# Patient Record
Sex: Female | Born: 2005 | Race: White | Hispanic: Yes | Marital: Single | Smoking: Never smoker
Health system: Southern US, Community
[De-identification: ages and names within clinical notes are randomized; demographics above are authoritative.]

## PROBLEM LIST (undated history)

## (undated) DIAGNOSIS — R569 Unspecified convulsions: Secondary | ICD-10-CM

---
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
## 2023-03-18 ENCOUNTER — Emergency Department (HOSPITAL_COMMUNITY)
Admission: EM | Admit: 2023-03-18 | Discharge: 2023-03-18 | Disposition: A | Discharge status: Home / Self Care | Payer: Medicaid Other | Attending: Emergency Medicine | Admitting: Emergency Medicine

## 2023-03-18 ENCOUNTER — Other Ambulatory Visit: Payer: Self-pay

## 2023-03-18 DIAGNOSIS — E860 Dehydration: Secondary | ICD-10-CM | POA: Diagnosis not present

## 2023-03-18 DIAGNOSIS — F329 Major depressive disorder, single episode, unspecified: Secondary | ICD-10-CM | POA: Insufficient documentation

## 2023-03-18 DIAGNOSIS — F32A Depression, unspecified: Secondary | ICD-10-CM | POA: Diagnosis not present

## 2023-03-18 DIAGNOSIS — R1084 Generalized abdominal pain: Secondary | ICD-10-CM | POA: Diagnosis not present

## 2023-03-18 DIAGNOSIS — R109 Unspecified abdominal pain: Secondary | ICD-10-CM | POA: Diagnosis present

## 2023-03-18 LAB — BASIC METABOLIC PANEL
Anion gap: 9 (ref 5–15)
BUN: 9 mg/dL (ref 6–20)
CO2: 25 mmol/L (ref 22–32)
Calcium: 9.3 mg/dL (ref 8.9–10.3)
Chloride: 104 mmol/L (ref 98–111)
Creatinine, Ser: 0.58 mg/dL (ref 0.44–1.00)
GFR, Estimated: >60 mL/min (ref >60)
Glucose, Bld: 97 mg/dL (ref 70–99)
Potassium: 3.8 mmol/L (ref 3.5–5.1)
Sodium: 138 mmol/L (ref 135–145)

## 2023-03-18 LAB — CBC WITH DIFFERENTIAL/PLATELET
Abs Immature Granulocytes: 0.02 10*3/uL (ref 0.00–0.07)
Basophils Absolute: 0 10*3/uL (ref 0.0–0.1)
Basophils Relative: 0 %
Eosinophils Absolute: 0 10*3/uL (ref 0.0–0.5)
Eosinophils Relative: 1 %
HCT: 37.2 % (ref 36.0–46.0)
Hemoglobin: 12 g/dL (ref 12.0–15.0)
Immature Granulocytes: 0 %
Lymphocytes Relative: 32 %
Lymphs Abs: 2.3 10*3/uL (ref 0.7–4.0)
MCH: 27.8 pg (ref 26.0–34.0)
MCHC: 32.3 g/dL (ref 30.0–36.0)
MCV: 86.1 fL (ref 80.0–100.0)
Monocytes Absolute: 0.5 10*3/uL (ref 0.1–1.0)
Monocytes Relative: 6 %
Neutro Abs: 4.4 10*3/uL (ref 1.7–7.7)
Neutrophils Relative %: 61 %
Platelets: 246 10*3/uL (ref 150–400)
RBC: 4.32 MIL/uL (ref 3.87–5.11)
RDW: 13.3 % (ref 11.5–15.5)
WBC: 7.2 10*3/uL (ref 4.0–10.5)
nRBC: 0 % (ref 0.0–0.2)

## 2023-03-18 LAB — I-STAT BETA HCG BLOOD, ED (MC, WL, AP ONLY): I-stat hCG, quantitative: <5 m[IU]/mL (ref <5)

## 2023-03-18 MED ORDER — LACTATED RINGERS IV BOLUS
2000.0000 mL | Freq: Once | INTRAVENOUS | Status: AC
  Administered 2023-03-18: 2000 mL via INTRAVENOUS

## 2023-03-18 MED ORDER — LACTATED RINGERS IV SOLN: INTRAVENOUS | Status: DC

## 2023-03-18 NOTE — ED Provider Notes (Signed)
[place] EMERGENCY DEPARTMENT AT [facility] Provider Note   CSN: [id number] Arrival date & time: 03/18/23  1611     History  Chief Complaint  Patient presents with   Abdominal Pain    [name] is a 17 y.o. female.  This is a 17 year old female with history depression presents after having a near syncopal event.  Patient states that she has not been eating or drinking well for last 2 days due to increased depression due to family issues.  Denies any suicidal or homicidal ideations.  Denies any recent donations.  Patient states that she got dizzy and lightheaded and almost passed out.  Also started her menstrual cycle today.  Denies any abdominal or chest pain today.  Called EMS and was transported here.       Home Medications Prior to Admission medications   Medication Sig Start Date End Date Taking? Authorizing Provider  acetaminophen (TYLENOL) 325 MG tablet Take 2 tablets (650 mg total) by mouth every 6 (six) hours as needed for mild pain or moderate pain. Patient not taking: No sig reported 06/23/17   Jean Rosenthal, NP  aluminum chloride (DRYSOL) 20 % external solution Apply topically at bedtime. Patient not taking: No sig reported 05/28/15   Olam Idler, MD  ibuprofen (ADVIL,MOTRIN) 800 MG tablet Take 1 tablet (800 mg total) by mouth every 8 (eight) hours as needed for mild pain, moderate pain or cramping. Patient not taking: No sig reported 06/23/17   Jean Rosenthal, NP  medroxyPROGESTERone (PROVERA) 10 MG tablet Take 1 tablet (10 mg total) by mouth daily. X 10 days/month to induce menses Patient not taking: No sig reported 03/08/19   Daisy Floro, DO  omeprazole (PRILOSEC) 20 MG capsule Take 1 capsule (20 mg total) by mouth 2 (two) times daily. Patient not taking: No sig reported 06/23/17   Jean Rosenthal, NP  ondansetron (ZOFRAN ODT) 4 MG disintegrating tablet Take 1 tablet (4 mg total) by mouth every 8 (eight) hours as  needed for nausea or vomiting. 02/06/21   Long, Wonda Olds, MD  terbinafine (LAMISIL) 1 % cream Apply to affected area BID until rash gone, then apply 2 more days. Patient not taking: No sig reported 04/15/15   Hilton Sinclair, MD  tretinoin microspheres (RETIN-A MICRO) 0.1 % gel Apply topically at bedtime. Patient not taking: No sig reported 06/07/17   Mayo, Pete Pelt, MD      Allergies    Patient has no known allergies.    Review of Systems   Review of Systems  All other systems reviewed and are negative.   Physical Exam Updated Vital Signs BP (!) 146/92 (BP Location: Left Arm)   Pulse 97   Temp 98.7 F (37.1 C) (Oral)   Resp 18   Ht 1.575 m (5\' 2" )   Wt 108.9 kg   LMP 03/18/2023   SpO2 97%   BMI 43.90 kg/m  Physical Exam Vitals and nursing note reviewed.  Constitutional:      General: She is not in acute distress.    Appearance: Normal appearance. She is well-developed. She is not toxic-appearing.  HENT:     Head: Normocephalic and atraumatic.  Eyes:     General: Lids are normal.     Conjunctiva/sclera: Conjunctivae normal.     Pupils: Pupils are equal, round, and reactive to light.  Neck:     Thyroid: No thyroid mass.     Trachea: No tracheal deviation.  Cardiovascular:     Rate and Rhythm: Normal rate and regular rhythm.     Heart sounds: Normal heart sounds. No murmur heard.    No gallop.  Pulmonary:     Effort: Pulmonary effort is normal. No respiratory distress.     Breath sounds: Normal breath sounds. No stridor. No decreased breath sounds, wheezing, rhonchi or rales.  Abdominal:     General: There is no distension.     Palpations: Abdomen is soft.     Tenderness: There is no abdominal tenderness. There is no rebound.  Musculoskeletal:        General: No tenderness. Normal range of motion.     Cervical back: Normal range of motion and neck supple.  Skin:    General: Skin is warm and dry.     Findings: No abrasion or rash.  Neurological:     Mental  Status: She is alert and oriented to person, place, and time. Mental status is at baseline.     GCS: GCS eye subscore is 4. GCS verbal subscore is 5. GCS motor subscore is 6.     Cranial Nerves: No cranial nerve deficit.     Sensory: No sensory deficit.     Motor: Motor function is intact.  Psychiatric:        Attention and Perception: Attention normal.        Mood and Affect: Affect is flat.        Behavior: Behavior is withdrawn.        Thought Content: Thought content does not include suicidal ideation. Thought content does not include suicidal plan.     ED Results / Procedures / Treatments   Labs (all labs ordered are listed, but only abnormal results are displayed) Labs Reviewed - No data to display  EKG None  Radiology No results found.  Procedures Procedures    Medications Ordered in ED Medications  lactated ringers bolus 2,000 mL (has no administration in time range)  lactated ringers infusion (has no administration in time range)    ED Course/ Medical Decision Making/ A&P                             Medical Decision Making Amount and/or Complexity of Data Reviewed Labs: ordered. ECG/medicine tests: ordered.  Risk Prescription drug management.   Patient IV hydrated here and feels better.  Labs here are reassuring.  Suspect she is mildly dehydrated.  She is back to her baseline.  Discussed her depression with her father who is at bedside.  He is not concerned that she has had any SI or HI.  States that he has a therapist that she can follow-up with.        Final Clinical Impression(s) / ED Diagnoses Final diagnoses:  None    Rx / DC Orders ED Discharge Orders     None         Lacretia Leigh, MD 03/18/23 [id number]

## 2023-03-18 NOTE — ED Triage Notes (Addendum)
Pt BIB EMS from home c/o lower abd cramping pt states she started her period today, "lightehead and dizzy while I was in the shower". Pt states she has not had an appetite and has not ate as much in the past few days. Depression, denies si

## 2023-05-01 ENCOUNTER — Telehealth: Payer: Self-pay

## 2023-05-01 NOTE — Telephone Encounter (Signed)
LVM for patient to call back. AS, CMA 

## 2024-07-30 DIAGNOSIS — F32A Depression, unspecified: Secondary | ICD-10-CM | POA: Insufficient documentation

## 2024-07-30 DIAGNOSIS — Z79899 Other long term (current) drug therapy: Secondary | ICD-10-CM | POA: Insufficient documentation

## 2024-07-30 DIAGNOSIS — F4323 Adjustment disorder with mixed anxiety and depressed mood: Secondary | ICD-10-CM | POA: Insufficient documentation

## 2024-07-30 DIAGNOSIS — Z638 Other specified problems related to primary support group: Secondary | ICD-10-CM | POA: Insufficient documentation

## 2024-07-30 DIAGNOSIS — Z793 Long term (current) use of hormonal contraceptives: Secondary | ICD-10-CM | POA: Insufficient documentation

## 2024-07-30 DIAGNOSIS — F29 Unspecified psychosis not due to a substance or known physiological condition: Secondary | ICD-10-CM | POA: Diagnosis not present

## 2024-07-30 NOTE — Progress Notes (Signed)
   07/30/24 2350  [facility] Triage Screening (Walk-ins at [facility] only)  How Did You Hear About Us ? Legal System  What Is the Reason for Your Visit/Call Today? Pt presents to [facility] as a voluntary walk-in, accompanied by BHRT and EMS due to worsening depression, stress and self-injurious behaviors. Pt reports that she got into an argument with her mother a few days ago as well as constantly feeling overwhelmed by caring for relatives. Pt reports that she often times has to make sure her grandfather gets to medical appointments and take care of her little sister as well. Pt reports that she woke up this morning and was dwelling things that happened over the course of a few days and she began making superficial cuts on her upper thigh and forearm using small school scissors. Pt denies psychiatric history, prior suicide attempts, and impatient hospitalizations. Pt denies being established with outpatient therapy at this time. Pt currently denies SI,HI,AVH and alcohol use.  How Long Has This Been Causing You Problems? <Week  Have You Recently Had Any Thoughts About Hurting Yourself? Yes  How long ago did you have thoughts about hurting yourself? earlier today  Are You Planning to Commit Suicide/Harm Yourself At This time? No  Have you Recently Had Thoughts About Hurting Someone Sher[place]? No  Are You Planning To Harm Someone At This Time? No  Physical Abuse Denies  Verbal Abuse Denies  Sexual Abuse Denies  Exploitation of patient/patient's resources Denies  Self-Neglect Denies  Possible abuse reported to:  (N/A)  Are you currently experiencing any auditory, visual or other hallucinations? No  Have You Used Any Alcohol or Drugs in the Past 24 Hours? Yes  What Did You Use and How Much? marijuana vape  Do you have any current medical co-morbidities that require immediate attention? No (pt reports hx of seizures)  Clinician description of patient physical appearance/behavior: calm, cooperative, pleasant, but tearful  at times  What Do You Feel Would Help You the Most Today? Treatment for Depression or other mood problem  If access to [facility] was not available, would you have sought care in the Emergency Department? Yes  Determination of Need Urgent (48 hours)  Options For Referral Other: Comment;Outpatient Therapy;BH Urgent Care;Medication Management;Inpatient Hospitalization  Determination of Need filed? Yes

## 2024-07-31 ENCOUNTER — Ambulatory Visit (HOSPITAL_COMMUNITY)
Admission: EM | Admit: 2024-07-31 | Discharge: 2024-07-31 | Disposition: A | Discharge status: Home / Self Care | Attending: Psychiatry | Admitting: Psychiatry

## 2024-07-31 DIAGNOSIS — F32A Depression, unspecified: Secondary | ICD-10-CM | POA: Diagnosis not present

## 2024-07-31 DIAGNOSIS — F4323 Adjustment disorder with mixed anxiety and depressed mood: Secondary | ICD-10-CM | POA: Diagnosis not present

## 2024-07-31 DIAGNOSIS — Z638 Other specified problems related to primary support group: Secondary | ICD-10-CM

## 2024-07-31 DIAGNOSIS — Z793 Long term (current) use of hormonal contraceptives: Secondary | ICD-10-CM | POA: Diagnosis not present

## 2024-07-31 DIAGNOSIS — Z79899 Other long term (current) drug therapy: Secondary | ICD-10-CM | POA: Diagnosis not present

## 2024-07-31 LAB — CBC WITH DIFFERENTIAL/PLATELET
Abs Immature Granulocytes: 0.03 K/uL (ref 0.00–0.07)
Basophils Absolute: 0 K/uL (ref 0.0–0.1)
Basophils Relative: 0 %
Eosinophils Absolute: 0 K/uL (ref 0.0–0.5)
Eosinophils Relative: 0 %
HCT: 36.3 % (ref 36.0–46.0)
Hemoglobin: 11.6 g/dL — ABNORMAL LOW (ref 12.0–15.0)
Immature Granulocytes: 0 %
Lymphocytes Relative: 24 %
Lymphs Abs: 2 K/uL (ref 0.7–4.0)
MCH: 26.9 pg (ref 26.0–34.0)
MCHC: 32 g/dL (ref 30.0–36.0)
MCV: 84.2 fL (ref 80.0–100.0)
Monocytes Absolute: 0.4 K/uL (ref 0.1–1.0)
Monocytes Relative: 5 %
Neutro Abs: 6.1 K/uL (ref 1.7–7.7)
Neutrophils Relative %: 71 %
Platelets: 297 K/uL (ref 150–400)
RBC: 4.31 MIL/uL (ref 3.87–5.11)
RDW: 13.4 % (ref 11.5–15.5)
WBC: 8.5 K/uL (ref 4.0–10.5)
nRBC: 0 % (ref 0.0–0.2)

## 2024-07-31 LAB — ETHANOL: Alcohol, Ethyl (B): <15 mg/dL (ref <15)

## 2024-07-31 LAB — COMPREHENSIVE METABOLIC PANEL WITH GFR
ALT: 15 U/L (ref 0–44)
AST: 17 U/L (ref 15–41)
Albumin: 4.4 g/dL (ref 3.5–5.0)
Alkaline Phosphatase: 41 U/L (ref 38–126)
Anion gap: 12 (ref 5–15)
BUN: 11 mg/dL (ref 6–20)
CO2: 25 mmol/L (ref 22–32)
Calcium: 10 mg/dL (ref 8.9–10.3)
Chloride: 102 mmol/L (ref 98–111)
Creatinine, Ser: 0.71 mg/dL (ref 0.44–1.00)
GFR, Estimated: >60 mL/min (ref >60)
Glucose, Bld: 91 mg/dL (ref 70–99)
Potassium: 3.9 mmol/L (ref 3.5–5.1)
Sodium: 139 mmol/L (ref 135–145)
Total Bilirubin: 0.8 mg/dL (ref 0.0–1.2)
Total Protein: 7.3 g/dL (ref 6.5–8.1)

## 2024-07-31 LAB — POCT URINE DRUG SCREEN - MANUAL ENTRY (I-SCREEN)
POC Amphetamine UR: NOT DETECTED
POC Buprenorphine (BUP): NOT DETECTED
POC Cocaine UR: NOT DETECTED
POC Marijuana UR: POSITIVE — AB
POC Methadone UR: NOT DETECTED
POC Methamphetamine UR: NOT DETECTED
POC Morphine: NOT DETECTED
POC Oxazepam (BZO): NOT DETECTED
POC Oxycodone UR: NOT DETECTED
POC Secobarbital (BAR): NOT DETECTED

## 2024-07-31 LAB — TSH: TSH: 0.968 u[IU]/mL (ref 0.350–4.500)

## 2024-07-31 LAB — POC URINE PREG, ED: Preg Test, Ur: NEGATIVE

## 2024-07-31 MED ORDER — OLANZAPINE 10 MG IM SOLR: 5.0000 mg | Freq: Three times a day (TID) | INTRAMUSCULAR | Status: DC | PRN

## 2024-07-31 MED ORDER — MAGNESIUM HYDROXIDE 400 MG/5ML PO SUSP: 30.0000 mL | Freq: Every day | ORAL | Status: DC | PRN

## 2024-07-31 MED ORDER — ACETAMINOPHEN 325 MG PO TABS
650.0000 mg | ORAL_TABLET | Freq: Four times a day (QID) | ORAL | Status: DC | PRN
  Administered 2024-07-31: 650 mg via ORAL
  Filled 2024-07-31: qty 2

## 2024-07-31 MED ORDER — DIPHENHYDRAMINE HCL 50 MG PO CAPS: 50.0000 mg | ORAL_CAPSULE | Freq: Three times a day (TID) | ORAL | Status: DC | PRN

## 2024-07-31 MED ORDER — HYDROXYZINE HCL 25 MG PO TABS: 25.0000 mg | ORAL_TABLET | Freq: Three times a day (TID) | ORAL | 0 refills | Status: AC | PRN

## 2024-07-31 MED ORDER — OLANZAPINE 10 MG IM SOLR: 10.0000 mg | Freq: Three times a day (TID) | INTRAMUSCULAR | Status: DC | PRN

## 2024-07-31 MED ORDER — ALUM & MAG HYDROXIDE-SIMETH 200-200-20 MG/5ML PO SUSP: 30.0000 mL | ORAL | Status: DC | PRN

## 2024-07-31 MED ORDER — HALOPERIDOL 5 MG PO TABS: 5.0000 mg | ORAL_TABLET | Freq: Three times a day (TID) | ORAL | Status: DC | PRN

## 2024-07-31 MED ORDER — HYDROXYZINE HCL 25 MG PO TABS
25.0000 mg | ORAL_TABLET | Freq: Once | ORAL | Status: AC
  Administered 2024-07-31: 25 mg via ORAL
  Filled 2024-07-31: qty 1

## 2024-07-31 NOTE — ED Notes (Signed)
 Pt sleeping in no acute distress. RR even and unlabored. Environment secured. Will continue to monitor for safety.

## 2024-07-31 NOTE — Discharge Instructions (Addendum)
 I will call you with your therapy appointment once we set up shortly. Return to [facility], call 911, or call 988 suicide hot line with any suicidal thoughts or thoughts to harm self. Pick up your hydroxzyine from [facility] on [address] Follow up with your PCP or go to walk in hours below for refills or to inquire another medication.   [facility] OUTPATIENT Walk-in information:  Please note, all walk-ins are first come & first serve, with limited number of availability.  Therapist for therapy:  Monday & Wednesdays: Please ARRIVE at 7:00 AM for registration Will START at 8:00 AM Every 1st & 2nd Friday of the month: Please ARRIVE at 10:00 AM for registration Will START at 1 PM - 5 PM Psychiatrist for medication management: Monday - Friday:  Please ARRIVE at 7:00 AM for registration Will START at 8:00 AM    Regretfully, due to limited availability, please be aware that you may not been seen on the same day as walk-in. Please consider making an appoint or try again. Thank you for your patience and understanding.  [facility] [place] [address]  [place], [place] [address] [phone]  New patients are seen at their walk-in clinic. Walk-in hours are Monday - Friday from 8:30 am - 12:00 pm, and from 1:00 pm - 2:30 pm.   Walk-in patients are seen on a first come, first served basis, so try to arrive as early as possible for the best chance of being seen the same day.      Based on the information you have provided and the presenting issue, outpatient services and resources for have been recommended.  It is imperative that you follow through with treatment recommendations within 5-7 days from the of discharge to mitigate further risk to your safety and mental well-being. A list of referrals has been provided below to get you started.  You are not limited to the list provided.  In case of an urgent crisis, you may contact the Mobile Crisis Unit  with [facility] at [phone].  Writer was able to link the patient with Olam Muss, LCSW to have an appointment with her virtually on Friday August 02, 2024 at [facility], MSW, [place], [place] [address] [place], [place] [address] Office [phone] Fax [phone]   I am pleased that you have selected me as your therapist.  I have a Master of Social Work degree, obtained at the [employer] [place] in 2000.  I am licensed with the [place]  [employer] Public librarian [phone]).   The services offered to you include individual, family, and group counseling.  My therapeutic approach  is derived from my training and experience in Cognitive-Behavioral Therapy, Solution Focused Therapy, Reality Therapy, as well as, interpersonal and developmental theories of counseling.  I have special interests in anxiety, depression, sexual and physical abuse, play therapy, child and adolescent development, and family systems.  I am specifically trained in Trauma Focused-Cognitive Behavioral Therapy (TF-CBT).  I will not discriminate because of age, race, gender, sexual orientation or religion.  If there is anything I should know concerning your culture or religion, I ask that you please inform me so that I can better understand you.  If for any reason, I determine that my knowledge and expertise are not sufficient for your particular needs, I will make every effort to refer you to another counselor who is  prepared to work more effectively with you.  Are you or a loved one dealing with the aftermath of a traumatic experience? Do life transitions leave you grappling with feelings of depression or anxiety? Are you concerned about your child's negative behaviors, sleep disturbances, or difficulties during a divorce? It's important to understand that children primarily operate based on emotions, while adults tend to use rational  thinking. This emotional focus can impact a child's ability to make effective decisions and express their feelings.   Services Individual Counseling  Individual counseling is a personal opportunity to receive support and experience growth during challenging times in life. Individual counseling can help one deal with many personal topics in life such as anger, depression, anxiety, substance abuse, marriage and relationship challenges, parenting problems, school difficulties, career changes, etc.  Family Counseling  Families often are faced with issues and challenges that greatly impact all the members. The goal of family therapy is to renew and maintain the natural family structure and support group with an emphasis on returning the individual to a healthy family and community life. Family counseling can help improve communication, resolve conflict, and improve connections. [/one-half-first]    Parenting Coordinator Parent Coordinator is a neutral third party acting in the children's best interest attempting to reach a fair compromise of the issues at hand. Assisting parents manage their parenting plan, improve communication, and resolve disputes. A court appointment neutral party.     Treatment specialization includes: Depression  Anxiety  Attention Deficit/Hyperactive Disorder  Grief Counseling  Conflict Resolution  Emotional Regulation  Family Counseling  Divorce/Separation  Mediation  Conflict Resolution  Relationship Building  Parenting Coordinator  Parental Support
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
## 2024-07-31 NOTE — ED Notes (Addendum)
 [name] arrived to the [facility] for observation for self injury, stress, and depression. Patient is A&Ox4. Patient admits to suicidal ideations currently, however she states that  I was just thinking about it and I had no plan and denies any homicidal ideations. Pt contracts for safety. Patient denies any auditory hallucinations, visual hallucinations, or CAH. Skin check conducted by Corean PEAK and [facility], [facility] . Patient has self harm scars on upper forearm, left and right upper thigh. Patient reports that right thigh was due to scratches and left thigh with scissors. Wounds are closed and slightly red.  Patient oriented to the unit and provided food, beverage, and snack. Patient given PRN Tylenol  650 mg PO for headache. Patient verbalized understanding. Patient currently resting in bed. No distress noted.
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
## 2024-07-31 NOTE — ED Notes (Signed)
 Patient currently sleeping and resting in recliner. RR even and unlabored, appearing in no noted distress. Environmental check complete

## 2024-07-31 NOTE — ED Notes (Signed)
 Patient A&Ox4. Denies intent to harm self/others when asked. Denies A/VH. Patient denies any physical complaints when asked. No acute distress noted. Pleasant and cooperative. Support and encouragement provided. Routine safety checks conducted according to facility protocol. Encouraged patient to notify staff if thoughts of harm toward self or others arise. Patient verbalize understanding and agreement. Will continue to monitor for safety.

## 2024-07-31 NOTE — ED Notes (Signed)
 Patient A&O x 4, ambulatory. Patient discharged in no acute distress. Patient denied SI/HI, A/VH upon discharge. Patient verbalized understanding of all discharge instructions reviewed on AVS via staff, to include follow up recommendations, RX's and safety. Suicide safety plan reviewed with Clinical research associate. Patient reported mood 10/10.  Pt belongings returned to patient from locker #21 intact. Patient escorted to lobby via staff for transport to destination. Safety maintained.

## 2024-08-05 DIAGNOSIS — F919 Conduct disorder, unspecified: Secondary | ICD-10-CM | POA: Diagnosis not present

## 2024-08-05 DIAGNOSIS — F411 Generalized anxiety disorder: Secondary | ICD-10-CM | POA: Diagnosis not present

## 2024-08-12 DIAGNOSIS — F411 Generalized anxiety disorder: Secondary | ICD-10-CM | POA: Diagnosis not present

## 2024-08-12 DIAGNOSIS — F919 Conduct disorder, unspecified: Secondary | ICD-10-CM | POA: Diagnosis not present

## 2024-08-19 DIAGNOSIS — F411 Generalized anxiety disorder: Secondary | ICD-10-CM | POA: Diagnosis not present

## 2024-08-19 DIAGNOSIS — F919 Conduct disorder, unspecified: Secondary | ICD-10-CM | POA: Diagnosis not present

## 2024-08-26 DIAGNOSIS — F919 Conduct disorder, unspecified: Secondary | ICD-10-CM | POA: Diagnosis not present

## 2024-08-26 DIAGNOSIS — F411 Generalized anxiety disorder: Secondary | ICD-10-CM | POA: Diagnosis not present

## 2024-09-02 DIAGNOSIS — F919 Conduct disorder, unspecified: Secondary | ICD-10-CM | POA: Diagnosis not present

## 2024-09-02 DIAGNOSIS — F411 Generalized anxiety disorder: Secondary | ICD-10-CM | POA: Diagnosis not present

## 2024-09-05 DIAGNOSIS — F919 Conduct disorder, unspecified: Secondary | ICD-10-CM | POA: Diagnosis not present

## 2024-09-05 DIAGNOSIS — F411 Generalized anxiety disorder: Secondary | ICD-10-CM | POA: Diagnosis not present

## 2024-09-09 DIAGNOSIS — F411 Generalized anxiety disorder: Secondary | ICD-10-CM | POA: Diagnosis not present

## 2024-09-09 DIAGNOSIS — F919 Conduct disorder, unspecified: Secondary | ICD-10-CM | POA: Diagnosis not present

## 2024-09-16 DIAGNOSIS — F919 Conduct disorder, unspecified: Secondary | ICD-10-CM | POA: Diagnosis not present

## 2024-09-16 DIAGNOSIS — F411 Generalized anxiety disorder: Secondary | ICD-10-CM | POA: Diagnosis not present

## 2024-09-23 DIAGNOSIS — F919 Conduct disorder, unspecified: Secondary | ICD-10-CM | POA: Diagnosis not present

## 2024-09-23 DIAGNOSIS — F411 Generalized anxiety disorder: Secondary | ICD-10-CM | POA: Diagnosis not present

## 2024-09-28 ENCOUNTER — Other Ambulatory Visit: Payer: Self-pay

## 2024-09-28 ENCOUNTER — Emergency Department (HOSPITAL_COMMUNITY)

## 2024-09-28 ENCOUNTER — Encounter (HOSPITAL_COMMUNITY): Payer: Self-pay

## 2024-09-28 ENCOUNTER — Emergency Department (HOSPITAL_COMMUNITY)
Admission: EM | Admit: 2024-09-28 | Discharge: 2024-09-28 | Disposition: A | Discharge status: Home / Self Care | Attending: Emergency Medicine | Admitting: Emergency Medicine

## 2024-09-28 DIAGNOSIS — M25532 Pain in left wrist: Secondary | ICD-10-CM | POA: Diagnosis present

## 2024-09-28 DIAGNOSIS — S63502A Unspecified sprain of left wrist, initial encounter: Secondary | ICD-10-CM | POA: Diagnosis not present

## 2024-09-28 DIAGNOSIS — S80811A Abrasion, right lower leg, initial encounter: Secondary | ICD-10-CM | POA: Insufficient documentation

## 2024-09-28 DIAGNOSIS — S20312A Abrasion of left front wall of thorax, initial encounter: Secondary | ICD-10-CM | POA: Insufficient documentation

## 2024-09-28 DIAGNOSIS — Y92410 Unspecified street and highway as the place of occurrence of the external cause: Secondary | ICD-10-CM | POA: Diagnosis not present

## 2024-09-28 DIAGNOSIS — S80812A Abrasion, left lower leg, initial encounter: Secondary | ICD-10-CM | POA: Insufficient documentation

## 2024-09-28 DIAGNOSIS — T07XXXA Unspecified multiple injuries, initial encounter: Secondary | ICD-10-CM

## 2024-09-28 MED ORDER — IBUPROFEN 800 MG PO TABS
800.0000 mg | ORAL_TABLET | Freq: Once | ORAL | Status: AC
  Administered 2024-09-28: 800 mg via ORAL
  Filled 2024-09-28: qty 1
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
## 2024-09-28 NOTE — Discharge Instructions (Addendum)
 Take Motrin  and Tylenol  as needed as directed for pain. Apply ice to the wrist for 20 minutes at a time as needed. Wear splint for comfort. Follow up with orthopedics in 1 week if not improving.  Return to ER for severe or concerning symptoms. See your PCP for recheck if not improving in 3-5 days.

## 2024-10-07 DIAGNOSIS — F411 Generalized anxiety disorder: Secondary | ICD-10-CM | POA: Diagnosis not present

## 2024-10-07 DIAGNOSIS — F919 Conduct disorder, unspecified: Secondary | ICD-10-CM | POA: Diagnosis not present

## 2024-10-14 DIAGNOSIS — F919 Conduct disorder, unspecified: Secondary | ICD-10-CM | POA: Diagnosis not present

## 2024-10-14 DIAGNOSIS — F411 Generalized anxiety disorder: Secondary | ICD-10-CM | POA: Diagnosis not present

## 2024-10-21 DIAGNOSIS — F919 Conduct disorder, unspecified: Secondary | ICD-10-CM | POA: Diagnosis not present

## 2024-10-21 DIAGNOSIS — F411 Generalized anxiety disorder: Secondary | ICD-10-CM | POA: Diagnosis not present

## 2024-10-24 ENCOUNTER — Emergency Department (HOSPITAL_COMMUNITY)

## 2024-10-24 ENCOUNTER — Emergency Department (HOSPITAL_COMMUNITY)
Admission: EM | Admit: 2024-10-24 | Discharge: 2024-10-24 | Disposition: A | Discharge status: Home / Self Care | Attending: Emergency Medicine | Admitting: Emergency Medicine

## 2024-10-24 DIAGNOSIS — R109 Unspecified abdominal pain: Secondary | ICD-10-CM | POA: Diagnosis not present

## 2024-10-24 DIAGNOSIS — N3000 Acute cystitis without hematuria: Secondary | ICD-10-CM | POA: Insufficient documentation

## 2024-10-24 DIAGNOSIS — R1010 Upper abdominal pain, unspecified: Secondary | ICD-10-CM | POA: Diagnosis not present

## 2024-10-24 DIAGNOSIS — R1012 Left upper quadrant pain: Secondary | ICD-10-CM | POA: Diagnosis not present

## 2024-10-24 LAB — CBC
HCT: 33 % — ABNORMAL LOW (ref 36.0–46.0)
Hemoglobin: 10 g/dL — ABNORMAL LOW (ref 12.0–15.0)
MCH: 25.3 pg — ABNORMAL LOW (ref 26.0–34.0)
MCHC: 30.3 g/dL (ref 30.0–36.0)
MCV: 83.3 fL (ref 80.0–100.0)
Platelets: 287 K/uL (ref 150–400)
RBC: 3.96 MIL/uL (ref 3.87–5.11)
RDW: 13.7 % (ref 11.5–15.5)
WBC: 7.1 K/uL (ref 4.0–10.5)
nRBC: 0 % (ref 0.0–0.2)

## 2024-10-24 LAB — COMPREHENSIVE METABOLIC PANEL WITH GFR
ALT: 9 U/L (ref 0–44)
AST: 14 U/L — ABNORMAL LOW (ref 15–41)
Albumin: 4.2 g/dL (ref 3.5–5.0)
Alkaline Phosphatase: 57 U/L (ref 38–126)
Anion gap: 11 (ref 5–15)
BUN: 9 mg/dL (ref 6–20)
CO2: 26 mmol/L (ref 22–32)
Calcium: 9.7 mg/dL (ref 8.9–10.3)
Chloride: 102 mmol/L (ref 98–111)
Creatinine, Ser: 0.61 mg/dL (ref 0.44–1.00)
GFR, Estimated: >60 mL/min (ref >60)
Glucose, Bld: 91 mg/dL (ref 70–99)
Potassium: 4.3 mmol/L (ref 3.5–5.1)
Sodium: 139 mmol/L (ref 135–145)
Total Bilirubin: 0.5 mg/dL (ref 0.0–1.2)
Total Protein: 7.4 g/dL (ref 6.5–8.1)

## 2024-10-24 LAB — URINALYSIS, ROUTINE W REFLEX MICROSCOPIC
Bacteria, UA: NONE SEEN
Bilirubin Urine: NEGATIVE
Glucose, UA: NEGATIVE mg/dL
Hgb urine dipstick: NEGATIVE
Ketones, ur: 5 mg/dL — AB
Nitrite: NEGATIVE
Protein, ur: 30 mg/dL — AB
Specific Gravity, Urine: 1.024 (ref 1.005–1.030)
WBC, UA: >50 WBC/hpf (ref 0–5)
pH: 7 (ref 5.0–8.0)

## 2024-10-24 LAB — LIPASE, BLOOD: Lipase: 19 U/L (ref 11–51)

## 2024-10-24 LAB — HCG, SERUM, QUALITATIVE: Preg, Serum: NEGATIVE

## 2024-10-24 MED ORDER — PANTOPRAZOLE SODIUM 40 MG IV SOLR
40.0000 mg | Freq: Once | INTRAVENOUS | Status: AC
  Administered 2024-10-24: 40 mg via INTRAVENOUS
  Filled 2024-10-24: qty 10

## 2024-10-24 MED ORDER — SULFAMETHOXAZOLE-TRIMETHOPRIM 800-160 MG PO TABS: 1.0000 | ORAL_TABLET | Freq: Two times a day (BID) | ORAL | 0 refills | Status: AC

## 2024-10-24 MED ORDER — SODIUM CHLORIDE 0.9 % IV BOLUS
1000.0000 mL | Freq: Once | INTRAVENOUS | Status: AC
  Administered 2024-10-24: 1000 mL via INTRAVENOUS

## 2024-10-24 MED ORDER — PANTOPRAZOLE SODIUM 20 MG PO TBEC: 20.0000 mg | DELAYED_RELEASE_TABLET | Freq: Every day | ORAL | 0 refills | Status: AC

## 2024-10-24 NOTE — ED Notes (Signed)
 ED Provider at bedside.

## 2024-10-24 NOTE — ED Notes (Signed)
 Pt ambulated to treatment room independently with steady gait. No signs of distress observed.

## 2024-10-24 NOTE — ED Triage Notes (Signed)
 C/o mid abd pain worse with eating and pushing when using the bathroom. Some N/D.

## 2024-10-24 NOTE — Discharge Instructions (Signed)
 Follow-up with your family doctor next week for recheck.

## 2024-10-25 NOTE — ED Provider Notes (Signed)
 [place] EMERGENCY DEPARTMENT AT [facility] Provider Note   CSN: [id number] Arrival date & time: 10/24/24  1745     Patient presents with: Abdominal Pain   [name] is a 18 y.o. female.   Patient complains of epigastric discomfort.  No vomiting no diarrhea no blood in her stool  The history is provided by the patient and medical records. No language interpreter was used.  Abdominal Pain Pain location:  Epigastric Pain quality: aching   Pain radiates to:  Does not radiate Pain severity:  Mild Onset quality:  Sudden Timing:  Constant Progression:  Waxing and waning Chronicity:  New Context: not alcohol use   Relieved by:  Nothing Associated symptoms: no chest pain, no cough, no diarrhea, no fatigue and no hematuria        Prior to Admission medications   Medication Sig Start Date End Date Taking? Authorizing Provider  pantoprazole (PROTONIX) 20 MG tablet Take 1 tablet (20 mg total) by mouth daily. 10/24/24  Yes Lazette Estala, MD  sulfamethoxazole-trimethoprim (BACTRIM DS) 800-160 MG tablet Take 1 tablet by mouth 2 (two) times daily for 7 days. 10/24/24 10/31/24 Yes Suzette Pac, MD  hydrOXYzine  (ATARAX ) 25 MG tablet Take 1 tablet (25 mg total) by mouth 3 (three) times daily as needed. For acute anxiety or at bedtime for helping to fall asleep. 07/31/24   Cornelius Dines, MD    Allergies: Patient has no known allergies.    Review of Systems  Constitutional:  Negative for appetite change and fatigue.  HENT:  Negative for congestion, ear discharge and sinus pressure.   Eyes:  Negative for discharge.  Respiratory:  Negative for cough.   Cardiovascular:  Negative for chest pain.  Gastrointestinal:  Positive for abdominal pain. Negative for diarrhea.  Genitourinary:  Negative for frequency and hematuria.  Musculoskeletal:  Negative for back pain.  Skin:  Negative for rash.  Neurological:  Negative for seizures and headaches.   Psychiatric/Behavioral:  Negative for hallucinations.     Updated Vital Signs BP 127/67 (BP Location: Left Arm)   Pulse 83   Temp 98.6 F (37 C) (Oral)   Resp 18   LMP 09/25/2024 (Exact Date)   SpO2 99%   Physical Exam Vitals and nursing note reviewed.  Constitutional:      Appearance: She is well-developed.  HENT:     Head: Normocephalic.     Nose: Nose normal.  Eyes:     General: No scleral icterus.    Conjunctiva/sclera: Conjunctivae normal.  Neck:     Thyroid : No thyromegaly.  Cardiovascular:     Rate and Rhythm: Normal rate and regular rhythm.     Heart sounds: No murmur heard.    No friction rub. No gallop.  Pulmonary:     Breath sounds: No stridor. No wheezing or rales.  Chest:     Chest wall: No tenderness.  Abdominal:     General: There is no distension.     Tenderness: There is abdominal tenderness. There is no rebound.  Musculoskeletal:        General: Normal range of motion.     Cervical back: Neck supple.  Lymphadenopathy:     Cervical: No cervical adenopathy.  Skin:    Findings: No erythema or rash.  Neurological:     Mental Status: She is alert and oriented to person, place, and time.     Motor: No abnormal muscle tone.     Coordination: Coordination normal.  Psychiatric:  Behavior: Behavior normal.     (all labs ordered are listed, but only abnormal results are displayed) Labs Reviewed  COMPREHENSIVE METABOLIC PANEL WITH GFR - Abnormal; Notable for the following components:      Result Value   AST 14 (*)    All other components within normal limits  CBC - Abnormal; Notable for the following components:   Hemoglobin 10.0 (*)    HCT 33.0 (*)    MCH 25.3 (*)    All other components within normal limits  URINALYSIS, ROUTINE W REFLEX MICROSCOPIC - Abnormal; Notable for the following components:   APPearance CLOUDY (*)    Ketones, ur 5 (*)    Protein, ur 30 (*)    Leukocytes,Ua LARGE (*)    All other components within normal limits   URINE CULTURE  LIPASE, BLOOD  HCG, SERUM, QUALITATIVE    EKG: None  Radiology: DG Abdomen Acute W/Chest Result Date: 10/24/2024 EXAM: UPRIGHT AND SUPINE XRAY VIEWS OF THE ABDOMEN AND 1 VIEW(S) OF THE CHEST 10/24/2024 11:27:00 PM COMPARISON: None available. CLINICAL HISTORY: pain pain FINDINGS: LUNGS AND PLEURA: No consolidation or pulmonary edema. No pleural effusion or pneumothorax. HEART AND MEDIASTINUM: No acute abnormality of the cardiac and mediastinal silhouettes. BOWEL: There are few borderline dilated loops of small bowel in the mid abdomen. Minimal colonic gas present. There is some questionable wall thickening of nondilated bowel in the right upper quadrant. There is a small air-fluid level in the stomach. No bowel obstruction. PERITONEUM AND SOFT TISSUES: No abnormal calcifications. No free air. BONES: No acute osseous abnormality. IMPRESSION: 1. No radiographic bowel obstruction or free air. 2. Questionable wall thickening of nondilated bowel in the right upper quadrant. Electronically signed by: Greig Pique MD 10/24/2024 11:40 PM EDT RP Workstation: [id number]     Procedures   Medications Ordered in the ED  sodium chloride 0.9 % bolus 1,000 mL (0 mLs Intravenous Stopped 10/24/24 2348)  pantoprazole (PROTONIX) injection 40 mg (40 mg Intravenous Given 10/24/24 2250)                                    Medical Decision Making Amount and/or Complexity of Data Reviewed Labs: ordered. Radiology: ordered.  Risk Prescription drug management.   Patient with epigastric discomfort that is most likely GERD.  She is started on Protonix.  Patient may also have a UTI.  Urine culture is sent and she started on Bactrim.  She will follow-up with her PCP     Final diagnoses:  Pain of upper abdomen  Acute cystitis without hematuria    ED Discharge Orders          Ordered    sulfamethoxazole-trimethoprim (BACTRIM DS) 800-160 MG tablet  2 times daily        10/24/24 2336     pantoprazole (PROTONIX) 20 MG tablet  Daily        10/24/24 2336               Suzette Pac, MD 10/25/24 1309

## 2024-10-26 LAB — URINE CULTURE

## 2024-10-28 DIAGNOSIS — F411 Generalized anxiety disorder: Secondary | ICD-10-CM | POA: Diagnosis not present

## 2024-10-28 DIAGNOSIS — F919 Conduct disorder, unspecified: Secondary | ICD-10-CM | POA: Diagnosis not present

## 2024-11-04 DIAGNOSIS — F411 Generalized anxiety disorder: Secondary | ICD-10-CM | POA: Diagnosis not present

## 2024-11-04 DIAGNOSIS — F919 Conduct disorder, unspecified: Secondary | ICD-10-CM | POA: Diagnosis not present

## 2024-11-11 DIAGNOSIS — F919 Conduct disorder, unspecified: Secondary | ICD-10-CM | POA: Diagnosis not present

## 2024-11-11 DIAGNOSIS — F411 Generalized anxiety disorder: Secondary | ICD-10-CM | POA: Diagnosis not present

## 2024-11-18 DIAGNOSIS — F411 Generalized anxiety disorder: Secondary | ICD-10-CM | POA: Diagnosis not present

## 2024-11-18 DIAGNOSIS — F919 Conduct disorder, unspecified: Secondary | ICD-10-CM | POA: Diagnosis not present

## 2024-11-25 DIAGNOSIS — F919 Conduct disorder, unspecified: Secondary | ICD-10-CM | POA: Diagnosis not present

## 2024-11-25 DIAGNOSIS — F411 Generalized anxiety disorder: Secondary | ICD-10-CM | POA: Diagnosis not present

## 2024-12-02 DIAGNOSIS — F919 Conduct disorder, unspecified: Secondary | ICD-10-CM | POA: Diagnosis not present

## 2024-12-02 DIAGNOSIS — F411 Generalized anxiety disorder: Secondary | ICD-10-CM | POA: Diagnosis not present

## 2024-12-09 DIAGNOSIS — F411 Generalized anxiety disorder: Secondary | ICD-10-CM | POA: Diagnosis not present

## 2024-12-09 DIAGNOSIS — F919 Conduct disorder, unspecified: Secondary | ICD-10-CM | POA: Diagnosis not present

## 2024-12-16 ENCOUNTER — Emergency Department (HOSPITAL_COMMUNITY)
Admission: EM | Admit: 2024-12-16 | Discharge: 2024-12-16 | Disposition: A | Discharge status: Home / Self Care | Attending: Emergency Medicine | Admitting: Emergency Medicine

## 2024-12-16 ENCOUNTER — Encounter (HOSPITAL_COMMUNITY): Payer: Self-pay

## 2024-12-16 ENCOUNTER — Other Ambulatory Visit: Payer: Self-pay

## 2024-12-16 DIAGNOSIS — R457 State of emotional shock and stress, unspecified: Secondary | ICD-10-CM | POA: Diagnosis not present

## 2024-12-16 DIAGNOSIS — N390 Urinary tract infection, site not specified: Secondary | ICD-10-CM | POA: Diagnosis not present

## 2024-12-16 DIAGNOSIS — F411 Generalized anxiety disorder: Secondary | ICD-10-CM | POA: Diagnosis not present

## 2024-12-16 DIAGNOSIS — F919 Conduct disorder, unspecified: Secondary | ICD-10-CM | POA: Diagnosis not present

## 2024-12-16 DIAGNOSIS — R42 Dizziness and giddiness: Secondary | ICD-10-CM | POA: Diagnosis not present

## 2024-12-16 DIAGNOSIS — N898 Other specified noninflammatory disorders of vagina: Secondary | ICD-10-CM | POA: Diagnosis present

## 2024-12-16 LAB — URINALYSIS, ROUTINE W REFLEX MICROSCOPIC
Bilirubin Urine: NEGATIVE
Glucose, UA: NEGATIVE mg/dL
Hgb urine dipstick: NEGATIVE
Ketones, ur: NEGATIVE mg/dL
Nitrite: NEGATIVE
Protein, ur: 100 mg/dL — AB
Specific Gravity, Urine: 1.025 (ref 1.005–1.030)
WBC, UA: >50 WBC/hpf (ref 0–5)
pH: 6 (ref 5.0–8.0)

## 2024-12-16 LAB — WET PREP, GENITAL
Clue Cells Wet Prep HPF POC: NONE SEEN
Sperm: NONE SEEN
Trich, Wet Prep: NONE SEEN
WBC, Wet Prep HPF POC: <10
Yeast Wet Prep HPF POC: NONE SEEN

## 2024-12-16 LAB — HIV ANTIBODY (ROUTINE TESTING W REFLEX): HIV Screen 4th Generation wRfx: NONREACTIVE

## 2024-12-16 LAB — HCG, SERUM, QUALITATIVE: Preg, Serum: NEGATIVE

## 2024-12-16 MED ORDER — CEPHALEXIN 500 MG PO CAPS
500.0000 mg | ORAL_CAPSULE | Freq: Once | ORAL | Status: AC
  Administered 2024-12-16: 500 mg via ORAL
  Filled 2024-12-16: qty 1

## 2024-12-16 MED ORDER — CEPHALEXIN 500 MG PO CAPS: 500.0000 mg | ORAL_CAPSULE | Freq: Four times a day (QID) | ORAL | 0 refills | Status: AC

## 2024-12-16 MED ADMIN — Fluconazole Tab 150 MG: 150 mg | ORAL | NDC 57237000511

## 2024-12-16 MED FILL — Fluconazole Tab 150 MG: 150.0000 mg | ORAL | Qty: 1 | Status: AC

## 2024-12-16 NOTE — ED Notes (Signed)
 Patient alert and oriented x 4. Airway patent, respirations even and unlabored. Skin normal, warm and dry. Discharge instructions discussed with patient and patient's family. Patient has no questions at this time. Patient has safe ride home.

## 2024-12-16 NOTE — ED Triage Notes (Signed)
 BIB EMS from home for white, thick vaginal discharge and itching for 10 days. Pt applied Monistat this AM and then began feeling anxious.

## 2024-12-16 NOTE — ED Provider Notes (Signed)
 " [place] EMERGENCY DEPARTMENT AT [facility] Provider Note   CSN: [id number] Arrival date & time: 12/16/24  9049     Patient presents with: Vaginal Discharge   [name] is a 18 y.o. female.    Vaginal Discharge  18 year old female presenting with vaginal discharge.  She states this has been going on for about 10 days.  She complains of some white discharge with itchiness and burning.  She tried taking Monistat this morning for the issue.  After taking it she states she started to feel little bit anxious so called 911 to be checked out at the ER.  She has not taken anything else for this.  She reports that she has never had anything like this.  She had some concerns for STI testing.  Patient denies any abdominal pain or chest pain.     Prior to Admission medications  Medication Sig Start Date End Date Taking? Authorizing Provider  hydrOXYzine  (ATARAX ) 25 MG tablet Take 1 tablet (25 mg total) by mouth 3 (three) times daily as needed. For acute anxiety or at bedtime for helping to fall asleep. 07/31/24   Cornelius Dines, MD  pantoprazole  (PROTONIX ) 20 MG tablet Take 1 tablet (20 mg total) by mouth daily. 10/24/24   Suzette Pac, MD    Allergies: Patient has no known allergies.    Review of Systems  Genitourinary:  Positive for vaginal discharge.    Updated Vital Signs BP 122/82   Pulse 71   Temp 98.1 F (36.7 C) (Oral)   Resp 18   Ht 5' 2 (1.575 m)   Wt 74.8 kg   SpO2 100%   BMI 30.18 kg/m   Physical Exam  (all labs ordered are listed, but only abnormal results are displayed) Labs Reviewed  WET PREP, GENITAL  SYPHILIS: RPR W/REFLEX TO RPR TITER AND TREPONEMAL ANTIBODIES, TRADITIONAL SCREENING AND DIAGNOSIS ALGORITHM  URINALYSIS, ROUTINE W REFLEX MICROSCOPIC  HIV ANTIBODY (ROUTINE TESTING W REFLEX)  HCG, SERUM, QUALITATIVE  GC/CHLAMYDIA PROBE AMP ([place]) NOT AT [facility]    EKG: None  Radiology: No results found.   Procedures    Medications Ordered in the ED - No data to display                                  Medical Decision Making Amount and/or Complexity of Data Reviewed Labs: ordered.   Impression: 18 year old female presenting with vaginal discharge.  Differential diagnoses include yeast infection, UTI, physiologic changes, STIs, PID  Additional History: Patient provided history.  I also reviewed her outpatient notes  Labs: Pregnancy test was negative.  Urinalysis showed a UTI with moderate leukocytes and many bacteria.  Other STI screenings are still pending.  Imaging: None  ED Course/Meds: Patient remained stable while in the ER.  I offered the patient a pelvic exam however she declined and stated that she wanted to first try to do the self swab.  I told her that if it came back positive and she wanted to do a pelvic exam we can still do a pelvic exam.  After your test results came back she still declined doing pelvic exam.  Urinalysis showed UTI which will be treated with an antibiotic.  Patient was educated that it is very important that she take the whole antibiotic.  Due to the patient's description of her discharge patient was given the first dose of Diflucan .  I talked  to the patient about the importance of following up with an OB/GYN if the symptoms continue.      Final diagnoses:  None    ED Discharge Orders     None          Rosaline Almarie KANDICE DEVONNA 12/16/24 1334    Ula Prentice SAUNDERS, MD 12/16/24 1423  "

## 2024-12-16 NOTE — Discharge Instructions (Addendum)
 Begin taking your antibiotic when you pick it up.  It is very important that you take a whole course of antibiotics.  You received your first dose of Diflucan  here today.  If your symptoms continue follow-up with the OB/GYN provided on your discharge paperwork.  If symptoms continue or get any worse please return to the ER.

## 2024-12-17 ENCOUNTER — Ambulatory Visit (HOSPITAL_COMMUNITY): Payer: Self-pay

## 2024-12-17 LAB — URINE CULTURE: Culture: NO GROWTH

## 2024-12-17 LAB — GC/CHLAMYDIA PROBE AMP (~~LOC~~) NOT AT ARMC
Chlamydia: POSITIVE — AB
Comment: NEGATIVE
Comment: NORMAL
Neisseria Gonorrhea: NEGATIVE

## 2024-12-17 LAB — SYPHILIS: RPR W/REFLEX TO RPR TITER AND TREPONEMAL ANTIBODIES, TRADITIONAL SCREENING AND DIAGNOSIS ALGORITHM: RPR Ser Ql: NONREACTIVE

## 2025-01-11 ENCOUNTER — Emergency Department (HOSPITAL_COMMUNITY)

## 2025-01-11 ENCOUNTER — Other Ambulatory Visit: Payer: Self-pay

## 2025-01-11 ENCOUNTER — Emergency Department (HOSPITAL_COMMUNITY)
Admission: EM | Admit: 2025-01-11 | Discharge: 2025-01-12 | Disposition: A | Attending: Emergency Medicine | Admitting: Emergency Medicine

## 2025-01-11 ENCOUNTER — Encounter (HOSPITAL_COMMUNITY): Payer: Self-pay

## 2025-01-11 DIAGNOSIS — N739 Female pelvic inflammatory disease, unspecified: Secondary | ICD-10-CM | POA: Insufficient documentation

## 2025-01-11 DIAGNOSIS — R1030 Lower abdominal pain, unspecified: Secondary | ICD-10-CM | POA: Diagnosis present

## 2025-01-11 DIAGNOSIS — R0789 Other chest pain: Secondary | ICD-10-CM | POA: Diagnosis not present

## 2025-01-11 DIAGNOSIS — N730 Acute parametritis and pelvic cellulitis: Secondary | ICD-10-CM

## 2025-01-11 LAB — URINALYSIS, ROUTINE W REFLEX MICROSCOPIC
Bilirubin Urine: NEGATIVE
Glucose, UA: NEGATIVE mg/dL
Hgb urine dipstick: NEGATIVE
Ketones, ur: NEGATIVE mg/dL
Nitrite: NEGATIVE
Protein, ur: NEGATIVE mg/dL
Specific Gravity, Urine: 1.027 (ref 1.005–1.030)
pH: 6 (ref 5.0–8.0)

## 2025-01-11 LAB — COMPREHENSIVE METABOLIC PANEL WITH GFR
ALT: 11 U/L (ref 0–44)
AST: 19 U/L (ref 15–41)
Albumin: 4.1 g/dL (ref 3.5–5.0)
Alkaline Phosphatase: 45 U/L (ref 38–126)
Anion gap: 8 (ref 5–15)
BUN: 10 mg/dL (ref 6–20)
CO2: 28 mmol/L (ref 22–32)
Calcium: 9.6 mg/dL (ref 8.9–10.3)
Chloride: 103 mmol/L (ref 98–111)
Creatinine, Ser: 0.64 mg/dL (ref 0.44–1.00)
GFR, Estimated: >60 mL/min
Glucose, Bld: 86 mg/dL (ref 70–99)
Potassium: 4.1 mmol/L (ref 3.5–5.1)
Sodium: 138 mmol/L (ref 135–145)
Total Bilirubin: 0.4 mg/dL (ref 0.0–1.2)
Total Protein: 8.3 g/dL — ABNORMAL HIGH (ref 6.5–8.1)

## 2025-01-11 LAB — WET PREP, GENITAL
Sperm: NONE SEEN
Trich, Wet Prep: NONE SEEN
WBC, Wet Prep HPF POC: >=10 — AB
Yeast Wet Prep HPF POC: NONE SEEN

## 2025-01-11 LAB — CBC WITH DIFFERENTIAL/PLATELET
Abs Immature Granulocytes: 0.01 K/uL (ref 0.00–0.07)
Basophils Absolute: 0 K/uL (ref 0.0–0.1)
Basophils Relative: 0 %
Eosinophils Absolute: 0 K/uL (ref 0.0–0.5)
Eosinophils Relative: 1 %
HCT: 30.1 % — ABNORMAL LOW (ref 36.0–46.0)
Hemoglobin: 9.6 g/dL — ABNORMAL LOW (ref 12.0–15.0)
Immature Granulocytes: 0 %
Lymphocytes Relative: 36 %
Lymphs Abs: 2.3 K/uL (ref 0.7–4.0)
MCH: 25.7 pg — ABNORMAL LOW (ref 26.0–34.0)
MCHC: 31.9 g/dL (ref 30.0–36.0)
MCV: 80.5 fL (ref 80.0–100.0)
Monocytes Absolute: 0.4 K/uL (ref 0.1–1.0)
Monocytes Relative: 6 %
Neutro Abs: 3.7 K/uL (ref 1.7–7.7)
Neutrophils Relative %: 57 %
Platelets: 270 K/uL (ref 150–400)
RBC: 3.74 MIL/uL — ABNORMAL LOW (ref 3.87–5.11)
RDW: 14.6 % (ref 11.5–15.5)
WBC: 6.4 K/uL (ref 4.0–10.5)
nRBC: 0 % (ref 0.0–0.2)

## 2025-01-11 LAB — PREGNANCY, URINE: Preg Test, Ur: NEGATIVE

## 2025-01-11 LAB — D-DIMER, QUANTITATIVE: D-Dimer, Quant: 3.45 ug{FEU}/mL — ABNORMAL HIGH (ref 0.00–0.50)

## 2025-01-11 LAB — TROPONIN T, HIGH SENSITIVITY: Troponin T High Sensitivity: <15 ng/L (ref 0–19)

## 2025-01-11 MED ORDER — IOHEXOL 350 MG/ML SOLN
75.0000 mL | Freq: Once | INTRAVENOUS | Status: AC | PRN
  Administered 2025-01-11: 75 mL via INTRAVENOUS

## 2025-01-11 NOTE — ED Triage Notes (Signed)
 Concern of ongoing UTI. Says sheen previously 1 month ago and sx lingering.   Also concern of intermittent sharp pain on inspiration over the past month. Pain usually in collarbone area.

## 2025-01-11 NOTE — ED Provider Notes (Signed)
" °  Physical Exam  BP (!) 116/58 (BP Location: Left Arm)   Pulse 81   Temp 98.8 F (37.1 C) (Oral)   Resp 17   Ht 5' 2 (1.575 m)   Wt 72.6 kg   SpO2 98%   BMI 29.26 kg/m   Physical Exam  Procedures  Procedures  ED Course / MDM    Medical Decision Making Amount and/or Complexity of Data Reviewed Labs: ordered. Radiology: ordered.  Risk Prescription drug management.   Pending pelvic US  If negative, treat for PID, f/u GYN Has dysuria, suprapubic pain since Xmas H/O recent untreated chlamydia (CT abd -?umbilical abscess - do not feel presence of abscess on PE. )  Pelvic US  results per radiology:   IMPRESSION: 1. 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  These results were discussed with GYN, Dr. Abigail, who advised she can be treated for PID and follow up in the outpatient setting.   IM Rocephin , 500 mg Doxycycline , 500 mg Flagyl  provided in eD.   Strongly encouraged her to follow up with GYN for recheck as recommended.       Odell Balls, PA-C 01/12/25 0210    Fredia Rosette Kirsch, MD 01/12/25 1630  "

## 2025-01-11 NOTE — ED Provider Notes (Signed)
 " [place] EMERGENCY DEPARTMENT AT [facility] Provider Note   CSN: [id number] Arrival date & time: 01/11/25  1858     Patient presents with: Dysuria   [name] is a 19 y.o. female.  {Add pertinent medical, surgical, social history, OB history to HPI:32947}  Dysuria      Prior to Admission medications  Medication Sig Start Date End Date Taking? Authorizing Provider  cephALEXin  (KEFLEX ) 500 MG capsule Take 1 capsule (500 mg total) by mouth 4 (four) times daily. 12/16/24   Rosaline Almarie MATSU, PA-C  hydrOXYzine  (ATARAX ) 25 MG tablet Take 1 tablet (25 mg total) by mouth 3 (three) times daily as needed. For acute anxiety or at bedtime for helping to fall asleep. 07/31/24   Cornelius Dines, MD  pantoprazole  (PROTONIX ) 20 MG tablet Take 1 tablet (20 mg total) by mouth daily. 10/24/24   Suzette Pac, MD    Allergies: Patient has no known allergies.    Review of Systems  Genitourinary:  Positive for dysuria.    Updated Vital Signs BP (!) 116/58 (BP Location: Left Arm)   Pulse 81   Temp 98.8 F (37.1 C) (Oral)   Resp 17   Ht 5' 2 (1.575 m)   Wt 72.6 kg   SpO2 98%   BMI 29.26 kg/m   Physical Exam  (all labs ordered are listed, but only abnormal results are displayed) Labs Reviewed  URINALYSIS, ROUTINE W REFLEX MICROSCOPIC - Abnormal; Notable for the following components:      Result Value   APPearance HAZY (*)    Leukocytes,Ua MODERATE (*)    Bacteria, UA RARE (*)    All other components within normal limits  CBC WITH DIFFERENTIAL/PLATELET - Abnormal; Notable for the following components:   RBC 3.74 (*)    Hemoglobin 9.6 (*)    HCT 30.1 (*)    MCH 25.7 (*)    All other components within normal limits  COMPREHENSIVE METABOLIC PANEL WITH GFR - Abnormal; Notable for the following components:   Total Protein 8.3 (*)    All other components within normal limits  D-DIMER, QUANTITATIVE - Abnormal; Notable for the following components:   D-Dimer,  Quant 3.45 (*)    All other components within normal limits  WET PREP, GENITAL  URINE CULTURE  PREGNANCY, URINE  GC/CHLAMYDIA PROBE AMP ([place]) NOT AT [facility]  TROPONIN T, HIGH SENSITIVITY  TROPONIN T, HIGH SENSITIVITY    EKG: None  Radiology: CT ABDOMEN PELVIS W CONTRAST Result Date: 01/11/2025 EXAM: CT ABDOMEN AND PELVIS WITH CONTRAST 01/11/2025 10:16:24 PM TECHNIQUE: CT of the abdomen and pelvis was performed with the administration of 75 mL of iohexol  (OMNIPAQUE ) 350 MG/ML injection. Multiplanar reformatted images are provided for review. Automated exposure control, iterative reconstruction, and/or weight-based adjustment of the mA/kV was utilized to reduce the radiation dose to as low as reasonably achievable. COMPARISON: None available. CLINICAL HISTORY: Abdominal pain, acute, nonlocalized. FINDINGS: LOWER CHEST: No acute abnormality. LIVER: COMPARISON: 03/21/2020 1.5T The liver is unremarkable. GALLBLADDER AND BILE DUCTS: Gallbladder is unremarkable. No biliary ductal dilatation. SPLEEN: No acute abnormality. PANCREAS: No acute abnormality. ADRENAL GLANDS: No acute abnormality. KIDNEYS, URETERS AND BLADDER: No stones in the kidneys or ureters. No hydronephrosis. No perinephric or periureteral stranding. Urinary bladder is unremarkable. GI AND BOWEL: Stomach demonstrates no acute abnormality. There is no bowel obstruction. Normal appendix. PERITONEUM AND RETROPERITONEUM: Small to moderate free fluid in the pelvis. No free air. VASCULATURE: Aorta is normal in caliber. LYMPH NODES:  No lymphadenopathy. REPRODUCTIVE ORGANS: No acute abnormality. BONES AND SOFT TISSUES: Thickening in the subcutaneous soft tissues in the region of the umbilicus with small locule of gas noted in the umbilicus. Question soft tissue infection. . Recommend clinical correlation for pain or signs of infection in the umbilical region. No acute osseous abnormality. IMPRESSION: 1. Thickening in the subcutaneous soft tissues  in the region of the umbilicus with small locule of gas, concerning for soft tissue infection; recommend evaluation for pain or signs of infection in the umbilical region. 2. Small to moderate free fluid in the pelvis. Electronically signed by: Franky Crease MD 01/11/2025 10:27 PM EST RP Workstation: [id number]   CT Angio Chest PE W and/or Wo Contrast Result Date: 01/11/2025 EXAM: CTA CHEST 01/11/2025 10:16:24 PM TECHNIQUE: CTA of the chest was performed after the administration of intravenous contrast without and with IV. Multiplanar reformatted images are provided for review. MIP images are provided for review. Automated exposure control, iterative reconstruction, and/or weight based adjustment of the mA/kV was utilized to reduce the radiation dose to as low as reasonably achievable. COMPARISON: None available. CLINICAL HISTORY: Pulmonary embolism (PE) suspected, low to intermediate prob, positive D-dimer FINDINGS: PULMONARY ARTERIES: Pulmonary arteries are adequately opacified for evaluation. No acute pulmonary embolus. Main pulmonary artery is normal in caliber. MEDIASTINUM: The heart and pericardium demonstrate no acute abnormality. There is no acute abnormality of the thoracic aorta. LYMPH NODES: No mediastinal, hilar or axillary lymphadenopathy. LUNGS AND PLEURA: The lungs are without acute process. No focal consolidation or pulmonary edema. No evidence of pleural effusion or pneumothorax. UPPER ABDOMEN: Limited images of the upper abdomen are unremarkable. SOFT TISSUES AND BONES: No acute bone or soft tissue abnormality. IMPRESSION: 1. No pulmonary embolism or acute pulmonary abnormality. 2. No acute cardiopulmonary disease. Electronically signed by: Franky Crease MD 01/11/2025 10:22 PM EST RP Workstation: [id number]    {Document cardiac monitor, telemetry assessment procedure when appropriate:32947} Procedures   Medications Ordered in the ED  iohexol  (OMNIPAQUE ) 350 MG/ML injection 75 mL (75 mLs  Intravenous Contrast Given 01/11/25 2216)      {Click here for ABCD2, HEART and other calculators REFRESH Note before signing:1}                              Medical Decision Making Amount and/or Complexity of Data Reviewed Labs: ordered. Radiology: ordered.  Risk Prescription drug management.   ***  {Document critical care time when appropriate  Document review of labs and clinical decision tools ie CHADS2VASC2, etc  Document your independent review of radiology images and any outside records  Document your discussion with family members, caretakers and with consultants  Document social determinants of health affecting pt's care  Document your decision making why or why not admission, treatments were needed:32947:::1}   Final diagnoses:  None    ED Discharge Orders     None        "

## 2025-01-12 DIAGNOSIS — N739 Female pelvic inflammatory disease, unspecified: Secondary | ICD-10-CM | POA: Diagnosis not present

## 2025-01-12 MED ORDER — STERILE WATER FOR INJECTION IJ SOLN
INTRAMUSCULAR | Status: AC
  Administered 2025-01-12: 1 mL
  Filled 2025-01-12: qty 10

## 2025-01-12 MED ORDER — CEFTRIAXONE SODIUM 1 G IJ SOLR
500.0000 mg | Freq: Once | INTRAMUSCULAR | Status: AC
  Administered 2025-01-12: 500 mg via INTRAMUSCULAR
  Filled 2025-01-12: qty 10

## 2025-01-12 MED ORDER — DOXYCYCLINE HYCLATE 100 MG PO TABS
100.0000 mg | ORAL_TABLET | Freq: Once | ORAL | Status: AC
  Administered 2025-01-12: 100 mg via ORAL
  Filled 2025-01-12: qty 1

## 2025-01-12 MED ORDER — METRONIDAZOLE 500 MG PO TABS: 500.0000 mg | ORAL_TABLET | Freq: Two times a day (BID) | ORAL | 0 refills | Status: AC

## 2025-01-12 MED ORDER — METRONIDAZOLE 500 MG PO TABS
500.0000 mg | ORAL_TABLET | Freq: Once | ORAL | Status: AC
  Administered 2025-01-12: 500 mg via ORAL
  Filled 2025-01-12: qty 1

## 2025-01-12 MED ORDER — DOXYCYCLINE HYCLATE 100 MG PO CAPS: 100.0000 mg | ORAL_CAPSULE | Freq: Two times a day (BID) | ORAL | 0 refills | Status: AC

## 2025-01-12 NOTE — Discharge Instructions (Signed)
 It is very important to follow up with the [facility] for [facility] for recheck in one week. Take the antibiotics as prescribed until fully completed. Do not stop the medications before you finish them.   If you develop severe pain, high fever, go to the MAU at [facility] for recheck and further evaluation.

## 2025-01-12 NOTE — Consult Note (Signed)
" ° °  OB/GYN Telephone Consult  01/12/2025   [name] is a 19 y.o. who is currently not pregnant presenting to [facility] ER.   I was called for a consult regarding the care of this patient by the PA caring for the patient.   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  The provider presented the following relevant clinical information: Pelvic ultrasound with bilateral hydrosalpinx, no TOA noted CT abdomen/pelvix with no abscesses noted Patient is afebrile  I performed a chart review on the patient and reviewed available documentation.  BP (!) 116/58 (BP Location: Left Arm)   Pulse 81   Temp 98.8 F (37.1 C) (Oral)   Resp 17   Ht 5' 2 (1.575 m)   Wt 72.6 kg   SpO2 98%   BMI 29.26 kg/m   Exam- performed by consulting provider   Recommendations:  -Treat for PID -Follow up as an outpatient for assessment of response to treatment and further management of hydrosalpinxes -Recommended MD/APP provide the patient with a referral to the [facility] for [facility] (any office) for follow up.   Thank you for this consult and if additional recommendations are needed please call [phone] for the OB/GYN attending on service at [facility].   I spent approximately 4 minutes directly consulting with the provider and verbally discussing this case. Additionally 3 minutes was spent performing chart review and documentation.    Rollo ONEIDA Bring, MD "

## 2025-01-13 LAB — URINE CULTURE

## 2025-01-13 LAB — GC/CHLAMYDIA PROBE AMP (~~LOC~~) NOT AT ARMC
Chlamydia: POSITIVE — AB
Comment: NEGATIVE
Comment: NORMAL
Neisseria Gonorrhea: NEGATIVE

## 2025-01-23 ENCOUNTER — Ambulatory Visit (HOSPITAL_COMMUNITY): Payer: Self-pay

## 2025-02-07 ENCOUNTER — Ambulatory Visit: Payer: Self-pay
